# Patient Record
Sex: Male | Born: 1980 | Race: Black or African American | Hispanic: No | Marital: Single | Smoking: Never smoker
Health system: Southern US, Community
[De-identification: ages and names within clinical notes are randomized; demographics above are authoritative.]

## PROBLEM LIST (undated history)

## (undated) DIAGNOSIS — M549 Dorsalgia, unspecified: Secondary | ICD-10-CM

## (undated) DIAGNOSIS — N319 Neuromuscular dysfunction of bladder, unspecified: Secondary | ICD-10-CM

## (undated) DIAGNOSIS — R202 Paresthesia of skin: Secondary | ICD-10-CM

## (undated) HISTORY — PX: OTHER SURGICAL HISTORY: SHX169

## (undated) HISTORY — DX: Paresthesia of skin: R20.2

## (undated) HISTORY — DX: Dorsalgia, unspecified: M54.9

## (undated) HISTORY — DX: Neuromuscular dysfunction of bladder, unspecified: N31.9

---

## 2015-02-04 ENCOUNTER — Emergency Department (HOSPITAL_COMMUNITY)
Admission: EM | Admit: 2015-02-04 | Discharge: 2015-02-04 | Disposition: A | Discharge status: Home / Self Care | Payer: Self-pay | Attending: Emergency Medicine | Admitting: Emergency Medicine

## 2015-02-04 ENCOUNTER — Encounter (HOSPITAL_COMMUNITY): Payer: Self-pay | Admitting: Emergency Medicine

## 2015-02-04 DIAGNOSIS — R350 Frequency of micturition: Secondary | ICD-10-CM | POA: Insufficient documentation

## 2015-02-04 DIAGNOSIS — G8929 Other chronic pain: Secondary | ICD-10-CM | POA: Insufficient documentation

## 2015-02-04 DIAGNOSIS — M549 Dorsalgia, unspecified: Secondary | ICD-10-CM | POA: Insufficient documentation

## 2015-02-04 DIAGNOSIS — R109 Unspecified abdominal pain: Secondary | ICD-10-CM | POA: Insufficient documentation

## 2015-02-04 DIAGNOSIS — R300 Dysuria: Secondary | ICD-10-CM | POA: Insufficient documentation

## 2015-02-04 LAB — COMPREHENSIVE METABOLIC PANEL
ALT: 17 U/L (ref 17–63)
ANION GAP: 10 (ref 5–15)
AST: 22 U/L (ref 15–41)
Albumin: 4 g/dL (ref 3.5–5.0)
Alkaline Phosphatase: 51 U/L (ref 38–126)
BUN: 12 mg/dL (ref 6–20)
CHLORIDE: 104 mmol/L (ref 101–111)
CO2: 23 mmol/L (ref 22–32)
Calcium: 8.8 mg/dL — ABNORMAL LOW (ref 8.9–10.3)
Creatinine, Ser: 1.05 mg/dL (ref 0.61–1.24)
GFR calc Af Amer: >60 mL/min (ref >60)
GFR calc non Af Amer: >60 mL/min (ref >60)
Glucose, Bld: 126 mg/dL — ABNORMAL HIGH (ref 65–99)
POTASSIUM: 4.1 mmol/L (ref 3.5–5.1)
Sodium: 137 mmol/L (ref 135–145)
Total Bilirubin: 0.4 mg/dL (ref 0.3–1.2)
Total Protein: 7.1 g/dL (ref 6.5–8.1)

## 2015-02-04 LAB — CBC WITH DIFFERENTIAL/PLATELET
Basophils Absolute: 0 10*3/uL (ref 0.0–0.1)
Basophils Relative: 0 % (ref 0–1)
EOS PCT: 1 % (ref 0–5)
Eosinophils Absolute: 0.1 10*3/uL (ref 0.0–0.7)
HCT: 42.4 % (ref 39.0–52.0)
HEMOGLOBIN: 14.4 g/dL (ref 13.0–17.0)
Lymphocytes Relative: 53 % — ABNORMAL HIGH (ref 12–46)
Lymphs Abs: 2.7 10*3/uL (ref 0.7–4.0)
MCH: 27.7 pg (ref 26.0–34.0)
MCHC: 34 g/dL (ref 30.0–36.0)
MCV: 81.7 fL (ref 78.0–100.0)
Monocytes Absolute: 0.4 10*3/uL (ref 0.1–1.0)
Monocytes Relative: 8 % (ref 3–12)
Neutro Abs: 1.9 10*3/uL (ref 1.7–7.7)
Neutrophils Relative %: 38 % — ABNORMAL LOW (ref 43–77)
PLATELETS: 165 10*3/uL (ref 150–400)
RBC: 5.19 MIL/uL (ref 4.22–5.81)
RDW: 12.8 % (ref 11.5–15.5)
WBC: 5.1 10*3/uL (ref 4.0–10.5)

## 2015-02-04 LAB — URINALYSIS, ROUTINE W REFLEX MICROSCOPIC
Bilirubin Urine: NEGATIVE
Glucose, UA: NEGATIVE mg/dL
Hgb urine dipstick: NEGATIVE
Ketones, ur: NEGATIVE mg/dL
Leukocytes, UA: NEGATIVE
Nitrite: NEGATIVE
Protein, ur: NEGATIVE mg/dL
SPECIFIC GRAVITY, URINE: 1.016 (ref 1.005–1.030)
Urobilinogen, UA: 1 mg/dL (ref 0.0–1.0)
pH: 7.5 (ref 5.0–8.0)

## 2015-02-04 LAB — I-STAT CREATININE, ED: Creatinine, Ser: 1.1 mg/dL (ref 0.61–1.24)

## 2015-02-04 NOTE — ED Notes (Signed)
Pt. reports urinary frequency , dysuria , back pain and left flank pain onset this week , denies hematuria , no fever or chills.

## 2015-02-04 NOTE — Discharge Instructions (Signed)
Please follow up closely with urologist for further evaluation of your condition.  Use resource below to find a primary care provider for further management of your health.   Dysuria Dysuria is the medical term for pain with urination. There are many causes for dysuria, but urinary tract infection is the most common. If a urinalysis was performed it can show that there is a urinary tract infection. A urine culture confirms that you or your child is sick. You will need to follow up with a healthcare provider because:  If a urine culture was done you will need to know the culture results and treatment recommendations.  If the urine culture was positive, you or your child will need to be put on antibiotics or know if the antibiotics prescribed are the right antibiotics for your urinary tract infection.  If the urine culture is negative (no urinary tract infection), then other causes may need to be explored or antibiotics need to be stopped. Today laboratory work may have been done and there does not seem to be an infection. If cultures were done they will take at least 24 to 48 hours to be completed. Today x-rays may have been taken and they read as normal. No cause can be found for the problems. The x-rays may be re-read by a radiologist and you will be contacted if additional findings are made. You or your child may have been put on medications to help with this problem until you can see your primary caregiver. If the problems get better, see your primary caregiver if the problems return. If you were given antibiotics (medications which kill germs), take all of the mediations as directed for the full course of treatment.  If laboratory work was done, you need to find the results. Leave a telephone number where you can be reached. If this is not possible, make sure you find out how you are to get test results. HOME CARE INSTRUCTIONS   Drink lots of fluids. For adults, drink eight, 8 ounce glasses of  clear juice or water a day. For children, replace fluids as suggested by your caregiver.  Empty the bladder often. Avoid holding urine for long periods of time.  After a bowel movement, women should cleanse front to back, using each tissue only once.  Empty your bladder before and after sexual intercourse.  Take all the medicine given to you until it is gone. You may feel better in a few days, but TAKE ALL MEDICINE.  Avoid caffeine, tea, alcohol and carbonated beverages, because they tend to irritate the bladder.  In men, alcohol may irritate the prostate.  Only take over-the-counter or prescription medicines for pain, discomfort, or fever as directed by your caregiver.  If your caregiver has given you a follow-up appointment, it is very important to keep that appointment. Not keeping the appointment could result in a chronic or permanent injury, pain, and disability. If there is any problem keeping the appointment, you must call back to this facility for assistance. SEEK IMMEDIATE MEDICAL CARE IF:   Back pain develops.  A fever develops.  There is nausea (feeling sick to your stomach) or vomiting (throwing up).  Problems are no better with medications or are getting worse. MAKE SURE YOU:   Understand these instructions.  Will watch your condition.  Will get help right away if you are not doing well or get worse. Document Released: 05/24/2004 Document Revised: 11/18/2011 Document Reviewed: 03/31/2008 [facility] Patient Information 2015 [place], [place]. This information is  not intended to replace advice given to you by your health care provider. Make sure you discuss any questions you have with your health care provider.   Emergency Department Resource Guide 1) Find a Doctor and Pay Out of Pocket Although you won't have to find out who is covered by your insurance plan, it is a good idea to ask around and get recommendations. You will then need to call the office and see if the  doctor you have chosen will accept you as a new patient and what types of options they offer for patients who are self-pay. Some doctors offer discounts or will set up payment plans for their patients who do not have insurance, but you will need to ask so you aren't surprised when you get to your appointment.  2) Contact Your Local Health Department Not all health departments have doctors that can see patients for sick visits, but many do, so it is worth a call to see if yours does. If you don't know where your local health department is, you can check in your phone book. The CDC also has a tool to help you locate your state's health department, and many state websites also have listings of all of their local health departments.  3) Find a Walk-in Clinic If your illness is not likely to be very severe or complicated, you may want to try a walk in clinic. These are popping up all over the country in pharmacies, drugstores, and shopping centers. They're usually staffed by nurse practitioners or physician assistants that have been trained to treat common illnesses and complaints. They're usually fairly quick and inexpensive. However, if you have serious medical issues or chronic medical problems, these are probably not your best option.  No Primary Care Doctor: - Call Health Connect at  [phone] - they can help you locate a primary care doctor that  accepts your insurance, provides certain services, etc. - Physician Referral Service- [phone]  Chronic Pain Problems: Organization         Address  Phone   Notes  [facility]  [phone] Patients need to be referred by their primary care doctor.   Medication Assistance: Organization         Address  Phone   Notes  [facility] Medication [facility] [address] [place], [place] [address] [phone] --Must be a resident of [facility] -- Must have NO insurance coverage whatsoever (no Medicaid/  Medicare, etc.) -- The pt. MUST have a primary care doctor that directs their care regularly and follows them in the community   Med[facility]ist  [phone]   [employer]  [phone]    Agencies that provide inexpensive medical care: Organization         Address  Phone   Notes  [facility]  [phone]   [facility]    [phone]   [facility] [address] [place], [place] [address] [phone]   [facility] [place] [facility] [address] [place] [place] [phone]   Planned Parenthood    [phone]   [facility]    [phone]   Community Health and [facility]  [address], [place] Phone:  [phone], Fax:  [phone] Hours of Operation:  9 am - 6 pm, M-F.  Also accepts Medicaid/Medicare and self-pay.  [facility]  [address], [place] Phone: [phone]) [phone], Fax: [phone]) [phone]. Hours of Operation:  8:30 am - 5:30 pm, M-F.  Also accepts Medicaid and self-pay.  [facility] [place] [address], [place] Phone: [phone](336) [phone]   [facility] [address], [place], [place] [phone], Ext. [phone] Mondays & Thursdays: 7-9 AM.  First 15 patients are seen on a first come, first serve basis.    Medicaid-accepting [facility] Providers:  Organization         Address  Phone   Notes  [facility] [address], [place] [phone]) [phone] Also accepts self-pay patients.  [facility] [address], [place]  [phone]) [phone]   [facility] [address], [place] [phone]) [phone]   [facility] [address] [place] [address], [place] [phone]) [phone]   Renaye RakersVeita Bland [address], [place]   [phone](336) [phone] Only accepts [place] Access [place] patients after they have their name applied to their card.    Self-Pay (no insurance) in [facility] [place]:  Organization         Address  Phone   Notes  Sickle Cell Patients, [facility] [address], [place] [phone]) [phone]   [facility] [address], [place] [phone]   [facility]  [address], [place] [phone]) [phone]   [facility]/Dr. Osei-Bonsu  [address] [place] [address], [place] or [phone], [place] [phone]) [phone] Phone number for both [place] and [place] locations is the same.  [facility]gent [facility] and [facility] [address], [place] [phone]) [phone]   [facility] [place] [address] [place] [address], [place] or [address] [phone]) [phone]   [facility] [address], [place] [phone], phone; [phone], fax Sees patients 1st and 3rd Saturday of every month.  Must not qualify for public or private insurance (i.e. Medicaid, Medicare, [place] Health Choice, Veterans' Benefits)  Household income should be no more than 200% of the poverty level The clinic cannot treat you if you are pregnant or think you are pregnant  Sexually transmitted diseases are not treated at the clinic.    Dental Care: Organization         Address  Phone  Notes  [facility] Department of [facility] [address], [place] [phone] Accepts children up to age 34 who are enrolled in [place] or [place] Health Choice; pregnant women with a Medicaid card; and children who have applied for Medicaid or [place] Health Choice, but were declined, whose parents can pay a reduced fee at time of service.  [facility] of [facility] [place]  [address], [place] [phone] Accepts children up to age 34 who are enrolled in [place] or [place] Health Choice; pregnant women with a Medicaid card; and children who have applied for Medicaid or  Health Choice,  but were declined, whose parents can pay a reduced fee at time of service.  [facility]  [address], [place] [phone]) [phone] Patients are seen by appointment only. Walk-ins are not accepted. [facility] will see patients 34 years of age and older. Monday - Tuesday (8am-5pm) Most Wednesdays (8:30-5pm) $30 per visit, cash only  [facility]  [address], [facility] [phone] Patients are seen by appointment only. Walk-ins are not accepted. [facility] will see patients 12 years of age and older. One Wednesday Evening (Monthly: Volunteer Based).  $30 per visit, cash only  [employer]  [phone] for adults; Children under age 36, call Graduate Pediatric Dentistry at [phone]. Children aged 62-14, please call [phone] to request a pediatric application.  Dental services are provided in all areas of dental care including fillings, crowns and bridges, complete and partial dentures, implants, gum treatment, root canals, and extractions. Preventive care is also provided. Treatment is provided to both adults and children. Patients are selected via a lottery and there is often a waiting list.   [facility] [address] [place]  [phone] [email or web address]   [facility] [address] [place], [place] [phone], Ext. [phone] Second and Fourth Thursday of each month, opens at 6:30 AM; Clinic ends at 9 AM.  Patients are seen on a first-come first-served basis, and a limited number are seen during each clinic.   [facility]  [address] [place], [place] [phone]   Eligibility Requirements You must have lived in [place], [place], or [place] counties for at least the last three months.   You cannot be eligible for state or federal sponsored [place], including [employer], [place], or [employer].   You generally  cannot be eligible for healthcare insurance through your employer.    How to apply: Eligibility screenings are held every Tuesday and Wednesday afternoon from 1:00 pm until 4:00 pm. You do not need an appointment for the interview!  [facility] [address], [place], [place] [phone]   [facility] Health Department  [phone]   [facility] Health Department  [phone]   [facility] Health Department  [phone]    Behavioral Health Resources in the Community: Intensive Outpatient Programs Organization         Address  Phone  Notes  [facility] Services [address] [place], [place] [phone]   [facility] Outpatient [address], [place], [place] [phone]   [facility] [address], [place], [place]  [phone]   [facility] [address]  [place], [place] [phone] or [phone]   Substance Abuse Resources Organization         Address  Phone  Notes  Al[facility] Drug Services  [phone]   [facility]  [phone]   [facility] [place]  [phone]   Floydene Flock  [phone]   Residential & Outpatient Substance Abuse Program  [phone]   Psychological Services Organization         Address  Phone  Notes  [facility]al Health  [phone]   [facility]  [phone]   [facility] Health [address] [place] [phone] or [phone]    Mobile Crisis Teams Organization         Address  Phone  Notes  [facility], Mobile Crisis Care Unit  [phone]   [facility]  [address] [place], [place] [phone]   Doristine Locks [address] [place] [phone]    Self-Help/Support Groups Organization         Address  Phone             Notes  [facility] [place] - variety  of support groups  336- [phone] Call for more  information  Narcotics Anonymous (NA), Caring Services [address], [employer] [place]  2 meetings at this location   Residential Sports administrator         Address  Phone  Notes  [facility] [address],    [place]  [phone]   [facility]  [address], [place] [address], [place], [place] [phone]   [facility] [address] [place], [place] [phone] Admissions: 8am-3pm M-F  [facility] [address],    [place], [place] [phone]   [facility] [address] [place], [place], [place] [phone]   [facility] [address],  [place], [place] [phone]   [facility] - Intensive Outpatient [address], [place], [place] [phone]   [facility] ([facility] [address] [place]  [place], [place] [phone] or [phone]   [facility] ([facility]) [address], [place], [place] [phone] Accepts Medicaid  [facility] [place] [address]  [place] [phone] Substance Abuse/Addiction Treatment   [facility] Organization         Address  Phone  Notes  [facility]  [phone]   Angie Fava, PhD [address] [place], [place]   [phone] or [phone]   [facility]   [address] [place], [place] [phone]   [facility] [address], [place], [place] [phone] Insurance/Medicaid/sponsorship through [facility] and Families [address]                                    [place], [place] [phone] Therapy/tele-psych/case  [facility] [address], [place] [phone]    Dr. Lolly Mustache  [phone]   [facility] [place]  [employer] [facility] Dept. 1) [address] [place] 2) [address] [place] 3)  [address], [place] [phone]  [phone]   [facility] Child Abuse Hotline 530-544-4879 or [phone] (After Hours)

## 2015-02-04 NOTE — ED Provider Notes (Signed)
CSN: [id number]     Arrival date & time 02/04/15  2153 History   First MD Initiated Contact with Patient 02/04/15 2213     Chief Complaint  Patient presents with  . Urinary Frequency  . Dysuria  . Back Pain     (Consider location/radiation/quality/duration/timing/severity/associated sxs/prior Treatment) HPI   34 year old male presents for evaluation of dysuria. Patient reports for more than a year he has had urinary frequency, burning urination, left flank pain and abdominal pain. Symptom has been waxing waning. Most recent dysuria has been ongoing for the past month and has progressively worse. States it burns when he urinates. Report having been evaluated for this multiple times in the past with occasional diagnosis of UTI but no diagnosis of STD. States that antibiotic does help temporarily but symptoms then returned. He has not been sexually active for the past 3 months. He denies any abnormal weight loss, fever, chills, nausea vomiting diarrhea, hematuria, penile discharge, or rash. He has never been seen by specialist for this. [place] from [place]. No prior history of kidney stones.  History reviewed. No pertinent past medical history. History reviewed. No pertinent past surgical history. No family history on file. History  Substance Use Topics  . Smoking status: Never Smoker   . Smokeless tobacco: Not on file  . Alcohol Use: No    Review of Systems  Constitutional: Negative for fever.  Genitourinary: Positive for dysuria, frequency and flank pain. Negative for hematuria, discharge, penile swelling, scrotal swelling and testicular pain.  Skin: Negative for rash.      Allergies  Review of patient's allergies indicates no known allergies.  Home Medications   Prior to Admission medications   Medication Sig Start Date End Date Taking? Authorizing Provider  ibuprofen (ADVIL,MOTRIN) 200 MG tablet Take 400 mg by mouth every 6 (six) hours as needed for moderate pain.   Yes  Historical Provider, MD   BP 147/87 mmHg  Pulse 76  Temp(Src) 98.3 F (36.8 C) (Oral)  Resp 16  Ht 5\' 9"  (1.753 m)  Wt 168 lb (76.204 kg)  BMI 24.80 kg/m2  SpO2 99% Physical Exam  Constitutional: He appears well-developed and well-nourished. No distress.  HENT:  Head: Atraumatic.  Eyes: Conjunctivae are normal.  Neck: Neck supple.  Cardiovascular: Normal rate and regular rhythm.   Pulmonary/Chest: Effort normal and breath sounds normal.  Abdominal: Soft. Bowel sounds are normal. He exhibits no distension. There is no tenderness.  Genitourinary:  Chaperone present: Circumcised penis with dry flaky skin to the corona without obvious infection. No penile discharge. Small Urethral opening. No testicular tenderness or scrotal swelling. No inguinal lymphadenopathy or inguinal hernia.  No CVA tenderness.  Neurological: He is alert.  Skin: No rash noted.  Psychiatric: He has a normal mood and affect.  Nursing note and vitals reviewed.   ED Course  Procedures (including critical care time)  10:42 PM Patient here with recurrent urinary frequency, flank pain and abdominal pain. Symptom has been ongoing for more than 1 year. I suspect that his symptoms would need to be evaluated further by urologist to identify an actual cause. Today his urinalysis shows no evidence of urinary tract infection.  11:15 PM Patient is afebrile, vital signs stable. Normal renal function, UA shows no evidence of urinary tract infection, labs are reassuring, electrolytes are reassuring. I would like patient to follow-up with specialist and I also provided resources for outpatient follow-up. Return precautions discussed.  Labs Review Labs Reviewed  CBC WITH DIFFERENTIAL/PLATELET -  Abnormal; Notable for the following:    Neutrophils Relative % 38 (*)    Lymphocytes Relative 53 (*)    All other components within normal limits  COMPREHENSIVE METABOLIC PANEL - Abnormal; Notable for the following:    Glucose,  Bld 126 (*)    Calcium 8.8 (*)    All other components within normal limits  URINALYSIS, ROUTINE W REFLEX MICROSCOPIC (NOT AT [facility])  RPR  HIV ANTIBODY (ROUTINE TESTING)  I-STAT CREATININE, ED  GC/CHLAMYDIA PROBE AMP () NOT AT [facility]    Imaging Review No results found.   EKG Interpretation None      MDM   Final diagnoses:  Dysuria  Left flank pain, chronic    BP 147/87 mmHg  Pulse 76  Temp(Src) 98.3 F (36.8 C) (Oral)  Resp 16  Ht  (1.753 m)  Wt 168 lb (76.204 kg)  BMI 24.80 kg/m2  SpO2 99%     Fayrene Helper, PA-C 02/04/15 2316  Donnetta Hutching, MD 02/04/15 2329

## 2015-02-05 LAB — RPR: RPR Ser Ql: NONREACTIVE

## 2015-02-05 LAB — HIV ANTIBODY (ROUTINE TESTING W REFLEX): HIV Screen 4th Generation wRfx: NONREACTIVE

## 2015-02-07 LAB — GC/CHLAMYDIA PROBE AMP (~~LOC~~) NOT AT ARMC
Chlamydia: NEGATIVE
Neisseria Gonorrhea: NEGATIVE

## 2015-05-30 ENCOUNTER — Ambulatory Visit (INDEPENDENT_AMBULATORY_CARE_PROVIDER_SITE_OTHER): Payer: BLUE CROSS/BLUE SHIELD | Admitting: Urgent Care

## 2015-05-30 VITALS — BP 118/72 | HR 67 | Temp 98.5°F | Resp 18 | Ht 70.0 in | Wt 175.0 lb

## 2015-05-30 DIAGNOSIS — R7309 Other abnormal glucose: Secondary | ICD-10-CM

## 2015-05-30 DIAGNOSIS — R350 Frequency of micturition: Secondary | ICD-10-CM | POA: Diagnosis not present

## 2015-05-30 DIAGNOSIS — R351 Nocturia: Secondary | ICD-10-CM

## 2015-05-30 DIAGNOSIS — R109 Unspecified abdominal pain: Secondary | ICD-10-CM | POA: Diagnosis not present

## 2015-05-30 DIAGNOSIS — R7303 Prediabetes: Secondary | ICD-10-CM

## 2015-05-30 DIAGNOSIS — R300 Dysuria: Secondary | ICD-10-CM

## 2015-05-30 DIAGNOSIS — N419 Inflammatory disease of prostate, unspecified: Secondary | ICD-10-CM | POA: Diagnosis not present

## 2015-05-30 LAB — POC MICROSCOPIC URINALYSIS (UMFC): Mucus: ABSENT

## 2015-05-30 LAB — POCT CBC
Granulocyte percent: 42.6 %G (ref 37–80)
HCT, POC: 46.2 % (ref 43.5–53.7)
HEMOGLOBIN: 14.4 g/dL (ref 14.1–18.1)
LYMPH, POC: 2.3 (ref 0.6–3.4)
MCH, POC: 25.8 pg — AB (ref 27–31.2)
MCHC: 31.1 g/dL — AB (ref 31.8–35.4)
MCV: 82.8 fL (ref 80–97)
MID (cbc): 0.4 (ref 0–0.9)
MPV: 9.2 fL (ref 0–99.8)
PLATELET COUNT, POC: 165 10*3/uL (ref 142–424)
POC Granulocyte: 2 (ref 2–6.9)
POC LYMPH %: 49.5 % (ref 10–50)
POC MID %: 7.9 %M (ref 0–12)
RBC: 5.58 M/uL (ref 4.69–6.13)
RDW, POC: 13.2 %
WBC: 4.6 10*3/uL (ref 4.6–10.2)

## 2015-05-30 LAB — POCT URINALYSIS DIP (MANUAL ENTRY)
Bilirubin, UA: NEGATIVE
Glucose, UA: NEGATIVE
Ketones, POC UA: NEGATIVE
LEUKOCYTES UA: NEGATIVE
Nitrite, UA: NEGATIVE
PH UA: 7.5
PROTEIN UA: NEGATIVE
Spec Grav, UA: 1.015
Urobilinogen, UA: 0.2

## 2015-05-30 LAB — POCT GLYCOSYLATED HEMOGLOBIN (HGB A1C): HEMOGLOBIN A1C: 5.9

## 2015-05-30 MED ORDER — CIPROFLOXACIN HCL 500 MG PO TABS: 500.0000 mg | ORAL_TABLET | Freq: Two times a day (BID) | ORAL | Status: DC

## 2015-05-30 NOTE — Patient Instructions (Signed)

## 2015-05-30 NOTE — Progress Notes (Signed)
MRN: [id number] DOB: 06/24/1981  Subjective:   Joseph Bridges is a 34 y.o. male presenting for chief complaint of burning with urination; Back Pain; Flank Pain; and Abdominal Pain  Reports >1 year history of intermittent dysuria, urinary frequency and intermittent hematuria. Patient has been seen by multiple providers at different practices for same complaint. He has undergone short courses of antibiotics for urinary tract infections. Patient states that he gets temporary relief from this but has had resolution. Today he reports that he continues to have urinary frequency, stinging and pressure sensation prior to urinating, now having some left-sided flank pain for the past month. Denies fever, nausea, vomiting, abdominal pain, hematuria, genital rashes, penile discharge. Patient has been tested for STI's and has been negative for HIV, syphilis, gonorrhea, Chlamydia. He has not been sexually active since his last testing. Not been evaluated by urology, denies history of kidney stones. Patient reports that he hydrates very well. Denies any other aggravating or relieving factors, no other questions or concerns.  [facility] has a current medication list which includes the following prescription(s): ibuprofen. Also has No Known Allergies.  [facility]  has no past medical history on file. Also  has no past surgical history on file.  Objective:   Vitals: BP 118/72 mmHg  Pulse 67  Temp(Src) 98.5 F (36.9 C) (Oral)  Resp 18  Ht  (1.778 m)  Wt 175 lb (79.379 kg)  BMI 25.11 kg/m2  SpO2 98%  Physical Exam  Constitutional: He is oriented to person, place, and time. He appears well-developed and well-nourished.  Cardiovascular: Normal rate.   Pulmonary/Chest: Effort normal.  Genitourinary: Rectal exam shows no external hemorrhoid, no internal hemorrhoid, no fissure, no mass and no tenderness.    Prostate is not enlarged (without nodules) and not tender.  Neurological: He is alert and  oriented to person, place, and time.  Skin: Skin is warm and dry. No rash noted. No erythema. No pallor.   Results for orders placed or performed in visit on 05/30/15 (from the past 24 hour(s))  POCT urinalysis dipstick     Status: Abnormal   Collection Time: 05/30/15  4:26 PM  Result Value Ref Range   Color, UA yellow yellow   Clarity, UA clear clear   Glucose, UA negative negative   Bilirubin, UA negative negative   Ketones, POC UA negative negative   Spec Grav, UA 1.015    Blood, UA trace-intact (A) negative   pH, UA 7.5    Protein Ur, POC negative negative   Urobilinogen, UA 0.2    Nitrite, UA Negative Negative   Leukocytes, UA Negative Negative  POCT Microscopic Urinalysis ([facility])     Status: Abnormal   Collection Time: 05/30/15  4:26 PM  Result Value Ref Range   WBC,UR,HPF,POC None None WBC/hpf   RBC,UR,HPF,POC Few (A) None RBC/hpf   Bacteria None None   Mucus Absent Absent   Epithelial Cells, UR Per Microscopy None None cells/hpf  POCT CBC     Status: Abnormal   Collection Time: 05/30/15  4:57 PM  Result Value Ref Range   WBC 4.6 4.6 - 10.2 K/uL   Lymph, poc 2.3 0.6 - 3.4   POC LYMPH PERCENT 49.5 10 - 50 %L   MID (cbc) 0.4 0 - 0.9   POC MID % 7.9 0 - 12 %M   POC Granulocyte 2.0 2 - 6.9   Granulocyte percent 42.6 37 - 80 %G   RBC 5.58 4.69 - 6.13 M/uL  Hemoglobin 14.4 14.1 - 18.1 g/dL   HCT, POC 21.3 08.6 - 53.7 %   MCV 82.8 80 - 97 fL   MCH, POC 25.8 (A) 27 - 31.2 pg   MCHC 31.1 (A) 31.8 - 35.4 g/dL   RDW, POC 57.8 %   Platelet Count, POC 165 142 - 424 K/uL   MPV 9.2 0 - 99.8 fL  POCT glycosylated hemoglobin (Hb A1C)     Status: None   Collection Time: 05/30/15  5:03 PM  Result Value Ref Range   Hemoglobin A1C 5.9     Assessment and Plan :   1. Prostatitis, unspecified prostatitis type 2. Burning with urination 3. Urinary frequency 4. 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 Consider possibility of  kidney stone although less likely given the long-standing symptoms. If no improvement with course of Cipro we'll refer to urology. Follow-up in 3 weeks.  5. Left sided abdominal pain - Comprehensive metabolic panel pending. May be referred pain from possible kidney stone.  6. Pre-diabetes - Advised dietary modifications, this may be a contributing factor to his urinary frequency.    Wallis Bamberg, PA-C [facility] [phone] 05/30/2015 4:24 PM

## 2015-05-31 LAB — COMPREHENSIVE METABOLIC PANEL
ALT: 28 U/L (ref 9–46)
AST: 23 U/L (ref 10–40)
Albumin: 4.6 g/dL (ref 3.6–5.1)
Alkaline Phosphatase: 50 U/L (ref 40–115)
BILIRUBIN TOTAL: 0.4 mg/dL (ref 0.2–1.2)
BUN: 12 mg/dL (ref 7–25)
CO2: 29 mmol/L (ref 20–31)
CREATININE: 1.1 mg/dL (ref 0.60–1.35)
Calcium: 9.4 mg/dL (ref 8.6–10.3)
Chloride: 101 mmol/L (ref 98–110)
Glucose, Bld: 72 mg/dL (ref 65–99)
Potassium: 4.1 mmol/L (ref 3.5–5.3)
SODIUM: 139 mmol/L (ref 135–146)
Total Protein: 7.6 g/dL (ref 6.1–8.1)

## 2015-06-01 ENCOUNTER — Telehealth: Payer: Self-pay | Admitting: Urgent Care

## 2015-06-01 LAB — PSA: PSA: 0.83 ng/mL (ref <=4.00)

## 2015-06-01 NOTE — Telephone Encounter (Signed)
Reported normal PSA and Cmet. Than likely patient has had chronic prostatitis but also possible that he has had renal stones. Continue plan as discussed in clinic including Cipro for 3 weeks. Recommended patient return to clinic if he has worsening symptoms. If there is no improvement with antibiotic course will send him to urology.

## 2015-06-16 ENCOUNTER — Telehealth: Payer: Self-pay

## 2015-06-16 DIAGNOSIS — R300 Dysuria: Secondary | ICD-10-CM

## 2015-06-16 DIAGNOSIS — N419 Inflammatory disease of prostate, unspecified: Secondary | ICD-10-CM

## 2015-06-16 NOTE — Telephone Encounter (Signed)
States medication did not work.  Wants something else called in to [facility] on [address]

## 2015-06-17 NOTE — Telephone Encounter (Signed)
Spoke with Joseph Bridges, referral is going to be put in for urology for patient.   Advised patient that if symptoms get worse he will need to RTC, Patient understood.

## 2015-06-20 ENCOUNTER — Telehealth: Payer: Self-pay | Admitting: *Deleted

## 2015-06-20 NOTE — Telephone Encounter (Signed)
Patient called wanting more medication symptoms are not getting better per Lanier Clam patient was to return to clinic.  Patient understood.

## 2015-06-22 ENCOUNTER — Ambulatory Visit (INDEPENDENT_AMBULATORY_CARE_PROVIDER_SITE_OTHER): Payer: BLUE CROSS/BLUE SHIELD | Admitting: Emergency Medicine

## 2015-06-22 VITALS — BP 110/74 | HR 67 | Temp 98.7°F | Resp 16 | Ht 69.5 in | Wt 172.8 lb

## 2015-06-22 DIAGNOSIS — R351 Nocturia: Secondary | ICD-10-CM

## 2015-06-22 DIAGNOSIS — R300 Dysuria: Secondary | ICD-10-CM | POA: Diagnosis not present

## 2015-06-22 DIAGNOSIS — N400 Enlarged prostate without lower urinary tract symptoms: Secondary | ICD-10-CM | POA: Diagnosis not present

## 2015-06-22 LAB — POC MICROSCOPIC URINALYSIS (UMFC): Mucus: ABSENT

## 2015-06-22 LAB — POCT URINALYSIS DIP (MANUAL ENTRY)
BILIRUBIN UA: NEGATIVE
BILIRUBIN UA: NEGATIVE
Blood, UA: NEGATIVE
GLUCOSE UA: NEGATIVE
LEUKOCYTES UA: NEGATIVE
NITRITE UA: NEGATIVE
Protein Ur, POC: NEGATIVE
Spec Grav, UA: 1.02
Urobilinogen, UA: 0.2
pH, UA: 7

## 2015-06-22 MED ORDER — TAMSULOSIN HCL 0.4 MG PO CAPS: 0.4000 mg | ORAL_CAPSULE | Freq: Every day | ORAL | Status: DC

## 2015-06-22 NOTE — Progress Notes (Signed)
Subjective:  Patient ID: Joseph Bridges, male    DOB: 12-27-1980  Age: 34 y.o. MRN: [id number]  CC: Follow-up   HPI [employer]ley [employer] presents  with continued symptoms of dysuria urgency and frequency. He says he has nocturia 2 or 3 times a night he has some initial hesitancy when he urinates. Doesn't have any dripping at the end has no fever or chills. He has no STDs. He's been tested repeatedly for STDs and undergone several treatments with antibiotics with no improvement in his symptoms  History Esaiah has no past medical history on file.   He has no past surgical history on file.   His  family history is not on file.  He   reports that he has never smoked. He does not have any smokeless tobacco history on file. He reports that he does not drink alcohol or use illicit drugs.  Outpatient Prescriptions Prior to Visit  Medication Sig Dispense Refill  . ciprofloxacin (CIPRO) 500 MG tablet Take 1 tablet (500 mg total) by mouth 2 (two) times daily. 42 tablet 0   No facility-administered medications prior to visit.    Social History   Social History  . Marital Status: Single    Spouse Name: N/A  . Number of Children: N/A  . Years of Education: N/A   Social History Main Topics  . Smoking status: Never Smoker   . Smokeless tobacco: None  . Alcohol Use: No  . Drug Use: No  . Sexual Activity: Not Asked   Other Topics Concern  . None   Social History Narrative     Review of Systems  Constitutional: Negative for fever, chills and appetite change.  HENT: Negative for congestion, ear pain, postnasal drip, sinus pressure and sore throat.   Eyes: Negative for pain and redness.  Respiratory: Negative for cough, shortness of breath and wheezing.   Cardiovascular: Negative for leg swelling.  Gastrointestinal: Negative for nausea, vomiting, abdominal pain, diarrhea, constipation and blood in stool.  Endocrine: Negative for polyuria.  Genitourinary: Positive for dysuria,  urgency and frequency. Negative for hematuria, flank pain, discharge, penile swelling, scrotal swelling, penile pain and testicular pain.  Musculoskeletal: Negative for gait problem.  Skin: Negative for rash.  Neurological: Negative for weakness and headaches.  Psychiatric/Behavioral: Negative for confusion and decreased concentration. The patient is not nervous/anxious.     Objective:  BP 110/74 mmHg  Pulse 67  Temp(Src) 98.7 F (37.1 C) (Oral)  Resp 16  Ht 5' 9.5" (1.765 m)  Wt 172 lb 12.8 oz (78.382 kg)  BMI 25.16 kg/m2  SpO2 99%  Physical Exam  Constitutional: He is oriented to person, place, and time. He appears well-developed and well-nourished. No distress.  HENT:  Head: Normocephalic and atraumatic.  Right Ear: External ear normal.  Left Ear: External ear normal.  Nose: Nose normal.  Eyes: Conjunctivae and EOM are normal. Pupils are equal, round, and reactive to light. No scleral icterus.  Neck: Normal range of motion. Neck supple. No tracheal deviation present.  Cardiovascular: Normal rate, regular rhythm and normal heart sounds.   Pulmonary/Chest: Effort normal. No respiratory distress. He has no wheezes. He has no rales.  Abdominal: He exhibits no mass. There is no tenderness. There is no rebound and no guarding.  Musculoskeletal: He exhibits no edema.  Lymphadenopathy:    He has no cervical adenopathy.  Neurological: He is alert and oriented to person, place, and time. Coordination normal.  Skin: Skin is warm and dry. No  rash noted.  Psychiatric: He has a normal mood and affect. His behavior is normal.      Assessment & Plan:   Clayton was seen today for follow-up.  Diagnoses and all orders for this visit:  Dysuria -     Ambulatory referral to Urology -     POCT urinalysis dipstick -     POCT Microscopic Urinalysis ([facility])  Prostatism  Nocturia  Other orders -     tamsulosin (FLOMAX) 0.4 MG CAPS capsule; Take 1 capsule (0.4 mg total) by mouth  daily.  I have discontinued Mr. Leitha Bleakkenten's ciprofloxacin. I am also having him start on tamsulosin.  Meds ordered this encounter  Medications  . tamsulosin (FLOMAX) 0.4 MG CAPS capsule    Sig: Take 1 capsule (0.4 mg total) by mouth daily.    Dispense:  30 capsule    Refill:  3    Appropriate red flag conditions were discussed with the patient as well as actions that should be taken.  Patient expressed his understanding.  Follow-up: Return if symptoms worsen or fail to improve.  Carmelina DaneAnderson, Caleah Tortorelli S, MD   Results for orders placed or performed in visit on 06/22/15  POCT urinalysis dipstick  Result Value Ref Range   Color, UA yellow yellow   Clarity, UA clear clear   Glucose, UA negative negative   Bilirubin, UA negative negative   Ketones, POC UA negative negative   Spec Grav, UA 1.020    Blood, UA negative negative   pH, UA 7.0    Protein Ur, POC negative negative   Urobilinogen, UA 0.2    Nitrite, UA Negative Negative   Leukocytes, UA Negative Negative  POCT Microscopic Urinalysis ([facility])  Result Value Ref Range   WBC,UR,HPF,POC None None WBC/hpf   RBC,UR,HPF,POC None None RBC/hpf   Bacteria None None   Mucus Absent Absent   Epithelial Cells, UR Per Microscopy None None cells/hpf

## 2015-06-22 NOTE — Patient Instructions (Signed)
Benign Prostatic Hyperplasia An enlarged prostate (benign prostatic hyperplasia) is common in older men. You may experience the following:  Weak urine stream.  Dribbling.  Feeling like the bladder has not emptied completely.  Difficulty starting urination.  Getting up frequently at night to urinate.  Urinating more frequently during the day. HOME CARE INSTRUCTIONS  Monitor your prostatic hyperplasia for any changes. The following actions may help to alleviate any discomfort you are experiencing:  Give yourself time when you urinate.  Stay away from alcohol.  Avoid beverages containing caffeine, such as coffee, tea, and colas, because they can make the problem worse.  Avoid decongestants, antihistamines, and some prescription medicines that can make the problem worse.  Follow up with your health care provider for further treatment as recommended. SEEK MEDICAL CARE IF:  You are experiencing progressive difficulty voiding.  Your urine stream is progressively getting narrower.  You are awaking from sleep with the urge to void more frequently.  You are constantly feeling the need to void.  You experience loss of urine, especially in small amounts. SEEK IMMEDIATE MEDICAL CARE IF:   You develop increased pain with urination or are unable to urinate.  You develop severe abdominal pain, vomiting, a high fever, or fainting.  You develop back pain or blood in your urine. MAKE SURE YOU:   Understand these instructions.  Will watch your condition.  Will get help right away if you are not doing well or get worse.   This information is not intended to replace advice given to you by your health care provider. Make sure you discuss any questions you have with your health care provider.   Document Released: 08/26/2005 Document Revised: 09/16/2014 Document Reviewed: 01/26/2013 Elsevier Interactive Patient Education 2016 Elsevier Inc.  

## 2015-07-04 ENCOUNTER — Telehealth: Payer: Self-pay

## 2015-07-04 NOTE — Telephone Encounter (Signed)
Pt called. He is still not doing well and his Uro appt isn't until 12/12. Advised to RTC for re check. He said he would RTC tomorrow.

## 2015-07-05 ENCOUNTER — Ambulatory Visit (INDEPENDENT_AMBULATORY_CARE_PROVIDER_SITE_OTHER): Payer: BLUE CROSS/BLUE SHIELD

## 2015-07-05 ENCOUNTER — Ambulatory Visit (INDEPENDENT_AMBULATORY_CARE_PROVIDER_SITE_OTHER): Payer: BLUE CROSS/BLUE SHIELD | Admitting: Family Medicine

## 2015-07-05 VITALS — BP 126/80 | HR 71 | Temp 98.9°F | Resp 18 | Ht 69.5 in | Wt 175.0 lb

## 2015-07-05 DIAGNOSIS — R109 Unspecified abdominal pain: Secondary | ICD-10-CM

## 2015-07-05 DIAGNOSIS — K639 Disease of intestine, unspecified: Secondary | ICD-10-CM | POA: Diagnosis not present

## 2015-07-05 LAB — POC MICROSCOPIC URINALYSIS (UMFC)

## 2015-07-05 LAB — POCT URINALYSIS DIP (MANUAL ENTRY)
Bilirubin, UA: NEGATIVE
Glucose, UA: NEGATIVE
Ketones, POC UA: NEGATIVE
Leukocytes, UA: NEGATIVE
Nitrite, UA: NEGATIVE
Protein Ur, POC: NEGATIVE
Spec Grav, UA: 1.02
Urobilinogen, UA: 0.2
pH, UA: 8

## 2015-07-05 MED ORDER — POLYETHYLENE GLYCOL 3350 17 GM/SCOOP PO POWD: 17.0000 g | Freq: Two times a day (BID) | ORAL | Status: DC | PRN

## 2015-07-05 NOTE — Progress Notes (Signed)
Patient ID: Joseph Bridges, male   DOB: 1981/05/08, 34 y.o.   MRN: [id number]  By signing my name below, I, [name], attest that this documentation has been prepared under the direction and in the presence of Elvina Sidle, MD Electronically Signed: Charline Bills, ED Scribe 07/05/2015 at 3:34 PM.  Patient ID: Joseph Bridges MRN: [id number], DOB: 04/04/1981, 34 y.o. Date of Encounter: 07/05/2015, 4:20 PM  Primary Physician: No primary care provider on file.  Chief Complaint  Patient presents with  . Follow-up    for back pain. it is not doing better, the pain got worse    HPI: 34 y.o. year old male with history below presents for a follow-up regarding left flank pain. Pt states that pain initially started 5-6 months ago but has worsened over the past 2 months. He further reports worsened flank pain over the past 4 days. He was seen 13 days ago for dysuria and urinary frequency. He has tried treating flank pain with Tylenol and ibuprofen without significant relief. Pt denies nausea and SOB. He also denies heavy lifting at work. Pt has been seen 4 times for similar symptoms since May 2016.   No past medical history on file.   Home Meds: Prior to Admission medications   Medication Sig Start Date End Date Taking? Authorizing Provider  tamsulosin (FLOMAX) 0.4 MG CAPS capsule Take 1 capsule (0.4 mg total) by mouth daily. 06/22/15  Yes Carmelina Dane, MD    Allergies: No Known Allergies  Social History   Social History  . Marital Status: Single    Spouse Name: N/A  . Number of Children: N/A  . Years of Education: N/A   Occupational History  . Not on file.   Social History Main Topics  . Smoking status: Never Smoker   . Smokeless tobacco: Not on file  . Alcohol Use: No  . Drug Use: No  . Sexual Activity: Not on file   Other Topics Concern  . Not on file   Social History Narrative     Review of Systems: Constitutional: negative for chills, fever, night  sweats, weight changes, or fatigue  HEENT: negative for vision changes, hearing loss, congestion, rhinorrhea, ST, epistaxis, or sinus pressure Cardiovascular: negative for chest pain or palpitations Respiratory: negative for hemoptysis, wheezing, shortness of breath, or cough Abdominal: negative for abdominal pain, nausea, vomiting, diarrhea, or constipation GU: +flank pain Dermatological: negative for rash Neurologic: negative for headache, dizziness, or syncope All other systems reviewed and are otherwise negative with the exception to those above and in the HPI.  Physical Exam: Blood pressure 126/80, pulse 71, temperature 98.9 F (37.2 C), temperature source Oral, resp. rate 18, height 5' 9.5" (1.765 m), weight 175 lb (79.379 kg), SpO2 99 %., Body mass index is 25.48 kg/(m^2). General: Well developed, well nourished, in no acute distress. Head: Normocephalic, atraumatic, eyes without discharge, sclera non-icteric, nares are without discharge. Bilateral auditory canals clear, TM's are without perforation, pearly grey and translucent with reflective cone of light bilaterally. Oral cavity moist, posterior pharynx without exudate, erythema, peritonsillar abscess, or post nasal drip.  Neck: Supple. No thyromegaly. Full ROM. No lymphadenopathy. Lungs: Clear bilaterally to auscultation without wheezes, rales, or rhonchi. Breathing is unlabored. Heart: RRR with S1 S2. No murmurs, rubs, or gallops appreciated. Abdomen: Soft, non-tender, non-distended with normoactive bowel sounds. No hepatomegaly. No rebound/guarding. No obvious abdominal masses. No flank tenderness. Msk:  Strength and tone normal for age. Extremities/Skin: Warm and dry. No clubbing or  cyanosis. No edema. No rashes or suspicious lesions. Negative straight leg raises.  Neuro: Alert and oriented X 3. Moves all extremities spontaneously. Gait is normal. CNII-XII grossly in tact. Psych:  Responds to questions appropriately with a normal  affect.   Labs: Results for orders placed or performed in visit on 07/05/15  POCT urinalysis dipstick  Result Value Ref Range   Color, UA yellow yellow   Clarity, UA clear clear   Glucose, UA negative negative   Bilirubin, UA negative negative   Ketones, POC UA negative negative   Spec Grav, UA 1.020    Blood, UA trace-intact (A) negative   pH, UA 8.0    Protein Ur, POC negative negative   Urobilinogen, UA 0.2    Nitrite, UA Negative Negative   Leukocytes, UA Negative Negative  POCT Microscopic Urinalysis ([facility])  Result Value Ref Range   WBC,UR,HPF,POC None None WBC/hpf   RBC,UR,HPF,POC Few (A) None RBC/hpf   Bacteria None None, Too numerous to count   Mucus Present (A) Absent   Epithelial Cells, UR Per Microscopy Few (A) None, Too numerous to count cells/hpf    [facility] reading (PRIMARY) by  Dr. Milus GlazierLauenstein:  KUB and lumbar --> negative   ASSESSMENT AND PLAN:  34 y.o. year old male with  1. Left flank pain   2. Splenic flexure syndrome    This chart was scribed in my presence and reviewed by me personally.    ICD-9-CM ICD-10-CM   1. Left flank pain 789.09 R10.9 POCT urinalysis dipstick     POCT Microscopic Urinalysis ([facility])     DG Abd 1 View     DG Lumbar Spine 2-3 Views     polyethylene glycol powder (GLYCOLAX/MIRALAX) powder  2. Splenic flexure syndrome 569.89 K63.9 polyethylene glycol powder (GLYCOLAX/MIRALAX) powder   I believe this man has a splenic flexure syndrome. Needs to take  MiraLAX for 2 days. If the symptoms continue then we will need to get further evaluation. In particular I think a CAT scan would be the next.  Signed, Elvina SidleKurt Ladavion Savitz, MD 07/05/2015 4:20 PM

## 2015-07-05 NOTE — Patient Instructions (Signed)
Please take the Miralax powder, mixed in water, twice a day for the next two days.  If you are not improving, let us know.  The next step would be to get a CAT scan

## 2015-07-10 ENCOUNTER — Telehealth: Payer: Self-pay

## 2015-07-10 NOTE — Telephone Encounter (Signed)
Pt was told if he wasn't any better to give him a call back and the medication isn't helping. Please call [phone]     [facility] [address]

## 2015-07-10 NOTE — Telephone Encounter (Signed)
Do you want to order a CT?

## 2015-07-11 MED ORDER — HYDROCODONE-ACETAMINOPHEN 5-325 MG PO TABS: 1.0000 | ORAL_TABLET | Freq: Four times a day (QID) | ORAL | Status: DC | PRN

## 2015-07-11 NOTE — Telephone Encounter (Signed)
Spoke with patient and states that miralax not helping and he is still having left sided flank pain. Says he feels hot at times, but denies n/v, fever, chills, dysuria, or hematuria. Advised that per Dr. Cain SaupeL's note the next step is CT. States that he can not afford another bill as they are starting to pile up. States that he has urology appt 11/13. Advised that if he is not willing to do CT we can treat pain until he sees urology, but if pain persist or worsens will need to come in and CT still may need to be ordered.

## 2015-07-11 NOTE — Telephone Encounter (Signed)
Pt calling again. Dr. Elbert EwingsL is not back until tomorrow. Please advise. Thanks

## 2015-08-07 ENCOUNTER — Ambulatory Visit (INDEPENDENT_AMBULATORY_CARE_PROVIDER_SITE_OTHER): Payer: BLUE CROSS/BLUE SHIELD | Admitting: Family Medicine

## 2015-08-07 VITALS — BP 114/72 | HR 66 | Temp 98.4°F | Resp 16 | Ht 70.0 in | Wt 174.0 lb

## 2015-08-07 DIAGNOSIS — R350 Frequency of micturition: Secondary | ICD-10-CM | POA: Diagnosis not present

## 2015-08-07 DIAGNOSIS — R319 Hematuria, unspecified: Secondary | ICD-10-CM

## 2015-08-07 DIAGNOSIS — R109 Unspecified abdominal pain: Secondary | ICD-10-CM

## 2015-08-07 DIAGNOSIS — R300 Dysuria: Secondary | ICD-10-CM

## 2015-08-07 NOTE — Addendum Note (Signed)
Addended by: Eddie CandleLUCK, Doni Widmer L on: 08/07/2015 08:40 PM   Modules accepted: Orders

## 2015-08-07 NOTE — Progress Notes (Signed)
 @[facility] @  This chart was scribed for Elvina SidleKurt Rada Zegers, MD by Andrew Auaven Small, ED Scribe. This patient was seen in room 3 and the patient's care was started at 7:19 PM.  Patient ID: Joseph Bridges MRN: [id number], DOB: 07/23/1981, 34 y.o. Date of Encounter: 08/07/2015, 7:13 PM  Primary Physician: No primary care provider on file.  Chief Complaint:  Chief Complaint  Patient presents with  . Follow-up    left flank    HPI: 34 y.o. year old male with history below presents with for a follow up regarding left flank pain. Pt was seen her 1 month ago for gradually worsening left flank pain that began 5-6 months prior to the visit. Since last visit he's had worsening, constant, left flank pain that occasionally radiates to left abdomen. He also continues to urinary symptoms of dysuria and urinary frequency. He has an appointment with Urology December 5th    History reviewed. No pertinent past medical history.   Home Meds: Prior to Admission medications   Medication Sig Start Date End Date Taking? Authorizing Provider  HYDROcodone-acetaminophen (NORCO) 5-325 MG tablet Take 1 tablet by mouth every 6 (six) hours as needed. Patient not taking: Reported on 08/07/2015 07/11/15   Tishira R Brewington, PA-C  polyethylene glycol powder (GLYCOLAX/MIRALAX) powder Take 17 g by mouth 2 (two) times daily as needed. Patient not taking: Reported on 08/07/2015 07/05/15   Elvina SidleKurt Haiven Nardone, MD    Allergies: No Known Allergies  Social History   Social History  . Marital Status: Single    Spouse Name: N/A  . Number of Children: N/A  . Years of Education: N/A   Occupational History  . Not on file.   Social History Main Topics  . Smoking status: Never Smoker   . Smokeless tobacco: Not on file  . Alcohol Use: No  . Drug Use: No  . Sexual Activity: Not on file   Other Topics Concern  . Not on file   Social History Narrative     Review of Systems: Constitutional: negative for chills, fever, night  sweats, weight changes, or fatigue  HEENT: negative for vision changes, hearing loss, congestion, rhinorrhea, ST, epistaxis, or sinus pressure Cardiovascular: negative for chest pain or palpitations Respiratory: negative for hemoptysis, wheezing, shortness of breath, or cough Abdominal: negative for abdominal pain, nausea, vomiting, diarrhea, or constipation Dermatological: negative for rash Neurologic: negative for headache, dizziness, or syncope All other systems reviewed and are otherwise negative with the exception to those above and in the HPI.   Physical Exam: Blood pressure 114/72, pulse 66, temperature 98.4 F (36.9 C), resp. rate 16, height 5\' 10"  (1.778 m), weight 174 lb (78.926 kg), SpO2 98 %., Body mass index is 24.97 kg/(m^2). General: Well developed, well nourished, in no acute distress. Head: Normocephalic, atraumatic, eyes without discharge, sclera non-icteric, nares are without discharge. Bilateral auditory canals clear, TM's are without perforation, pearly grey and translucent with reflective cone of light bilaterally. Oral cavity moist, posterior pharynx without exudate, erythema, peritonsillar abscess, or post nasal drip.  Neck: Supple. No thyromegaly. Full ROM. No lymphadenopathy. Lungs: Clear bilaterally to auscultation without wheezes, rales, or rhonchi. Breathing is unlabored. Heart: RRR with S1 S2. No murmurs, rubs, or gallops appreciated. Abdomen: Soft, non-tender, non-distended with normoactive bowel sounds. No hepatomegaly. No rebound/guarding. No obvious abdominal masses. Msk:  Strength and tone normal for age. Extremities/Skin: Warm and dry. No clubbing or cyanosis. No edema. No rashes or suspicious lesions. Neuro: Alert and oriented X 3. Moves all extremities  spontaneously. Gait is normal. CNII-XII grossly in tact. Psych:  Responds to questions appropriately with a normal affect.   Results for orders placed or performed in visit on 07/05/15  POCT urinalysis  dipstick  Result Value Ref Range   Color, UA yellow yellow   Clarity, UA clear clear   Glucose, UA negative negative   Bilirubin, UA negative negative   Ketones, POC UA negative negative   Spec Grav, UA 1.020    Blood, UA trace-intact (A) negative   pH, UA 8.0    Protein Ur, POC negative negative   Urobilinogen, UA 0.2    Nitrite, UA Negative Negative   Leukocytes, UA Negative Negative  POCT Microscopic Urinalysis ([facility])  Result Value Ref Range   WBC,UR,HPF,POC None None WBC/hpf   RBC,UR,HPF,POC Few (A) None RBC/hpf   Bacteria None None, Too numerous to count   Mucus Present (A) Absent   Epithelial Cells, UR Per Microscopy Few (A) None, Too numerous to count cells/hpf    ASSESSMENT AND PLAN:  34 y.o. year old male with This chart was scribed in my presence and reviewed by me personally.  Patient has persistent left flank pain so we need further workup with imaging of the kidneys first, followed by urology consult next Monday and possible GI consult.  Note was scribed by Ms. Small   By signing my name below, I, Raven Small, attest that this documentation has been prepared under the direction and in the presence of Elvina Sidle, MD.  Electronically Signed: Andrew Au, ED Scribe. 08/07/2015. 7:21 PM.  Signed, Elvina Sidle, MD 08/07/2015 7:13 PM

## 2015-08-08 ENCOUNTER — Ambulatory Visit (HOSPITAL_BASED_OUTPATIENT_CLINIC_OR_DEPARTMENT_OTHER)
Admission: RE | Admit: 2015-08-08 | Discharge: 2015-08-08 | Disposition: A | Discharge status: Home / Self Care | Payer: BLUE CROSS/BLUE SHIELD | Source: Ambulatory Visit | Attending: Family Medicine | Admitting: Family Medicine

## 2015-08-08 DIAGNOSIS — R350 Frequency of micturition: Secondary | ICD-10-CM | POA: Insufficient documentation

## 2015-08-08 DIAGNOSIS — R300 Dysuria: Secondary | ICD-10-CM

## 2015-08-08 DIAGNOSIS — R109 Unspecified abdominal pain: Secondary | ICD-10-CM | POA: Insufficient documentation

## 2015-08-08 DIAGNOSIS — R319 Hematuria, unspecified: Secondary | ICD-10-CM | POA: Insufficient documentation

## 2015-08-15 ENCOUNTER — Other Ambulatory Visit: Payer: Self-pay | Admitting: Urology

## 2015-08-15 DIAGNOSIS — M545 Low back pain: Secondary | ICD-10-CM

## 2015-08-22 ENCOUNTER — Other Ambulatory Visit (HOSPITAL_COMMUNITY): Payer: BLUE CROSS/BLUE SHIELD

## 2016-03-07 ENCOUNTER — Other Ambulatory Visit: Payer: Self-pay | Admitting: Urology

## 2016-03-07 DIAGNOSIS — R3915 Urgency of urination: Secondary | ICD-10-CM

## 2016-04-01 ENCOUNTER — Encounter (HOSPITAL_COMMUNITY): Payer: Self-pay

## 2016-04-02 ENCOUNTER — Other Ambulatory Visit: Payer: Self-pay | Admitting: Urology

## 2016-04-02 ENCOUNTER — Ambulatory Visit (HOSPITAL_COMMUNITY)
Admission: RE | Admit: 2016-04-02 | Discharge: 2016-04-02 | Disposition: A | Discharge status: Home / Self Care | Payer: BLUE CROSS/BLUE SHIELD | Source: Ambulatory Visit | Attending: Urology | Admitting: Urology

## 2016-04-02 DIAGNOSIS — R3915 Urgency of urination: Secondary | ICD-10-CM

## 2016-04-08 ENCOUNTER — Ambulatory Visit (HOSPITAL_COMMUNITY)
Admission: RE | Admit: 2016-04-08 | Discharge: 2016-04-08 | Disposition: A | Discharge status: Home / Self Care | Payer: BLUE CROSS/BLUE SHIELD | Source: Ambulatory Visit | Attending: Urology | Admitting: Urology

## 2016-04-08 DIAGNOSIS — R3915 Urgency of urination: Secondary | ICD-10-CM | POA: Diagnosis present

## 2016-04-08 DIAGNOSIS — M5442 Lumbago with sciatica, left side: Secondary | ICD-10-CM | POA: Diagnosis not present

## 2016-04-08 MED ORDER — GADOBENATE DIMEGLUMINE 529 MG/ML IV SOLN: 15.0000 mL | Freq: Once | INTRAVENOUS | Status: DC | PRN

## 2016-04-25 ENCOUNTER — Encounter: Payer: Self-pay | Admitting: Neurology

## 2016-04-25 ENCOUNTER — Ambulatory Visit (INDEPENDENT_AMBULATORY_CARE_PROVIDER_SITE_OTHER): Payer: BLUE CROSS/BLUE SHIELD | Admitting: Neurology

## 2016-04-25 VITALS — BP 123/71 | HR 75 | Ht 70.0 in | Wt 176.2 lb

## 2016-04-25 DIAGNOSIS — R202 Paresthesia of skin: Secondary | ICD-10-CM | POA: Diagnosis not present

## 2016-04-25 DIAGNOSIS — E531 Pyridoxine deficiency: Secondary | ICD-10-CM | POA: Diagnosis not present

## 2016-04-25 DIAGNOSIS — E538 Deficiency of other specified B group vitamins: Secondary | ICD-10-CM | POA: Diagnosis not present

## 2016-04-25 DIAGNOSIS — M546 Pain in thoracic spine: Secondary | ICD-10-CM

## 2016-04-25 DIAGNOSIS — R29898 Other symptoms and signs involving the musculoskeletal system: Secondary | ICD-10-CM | POA: Diagnosis not present

## 2016-04-25 DIAGNOSIS — E519 Thiamine deficiency, unspecified: Secondary | ICD-10-CM | POA: Diagnosis not present

## 2016-04-25 DIAGNOSIS — G629 Polyneuropathy, unspecified: Secondary | ICD-10-CM | POA: Diagnosis not present

## 2016-04-25 DIAGNOSIS — N319 Neuromuscular dysfunction of bladder, unspecified: Secondary | ICD-10-CM

## 2016-04-25 DIAGNOSIS — R51 Headache: Secondary | ICD-10-CM

## 2016-04-25 DIAGNOSIS — E559 Vitamin D deficiency, unspecified: Secondary | ICD-10-CM | POA: Diagnosis not present

## 2016-04-25 DIAGNOSIS — M5440 Lumbago with sciatica, unspecified side: Secondary | ICD-10-CM

## 2016-04-25 DIAGNOSIS — R208 Other disturbances of skin sensation: Secondary | ICD-10-CM

## 2016-04-25 DIAGNOSIS — R7309 Other abnormal glucose: Secondary | ICD-10-CM | POA: Diagnosis not present

## 2016-04-25 DIAGNOSIS — M2550 Pain in unspecified joint: Secondary | ICD-10-CM | POA: Diagnosis not present

## 2016-04-25 DIAGNOSIS — R5383 Other fatigue: Secondary | ICD-10-CM

## 2016-04-25 DIAGNOSIS — R519 Headache, unspecified: Secondary | ICD-10-CM
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
## 2016-04-27 ENCOUNTER — Encounter: Payer: Self-pay | Admitting: Neurology

## 2016-04-27 DIAGNOSIS — M549 Dorsalgia, unspecified: Secondary | ICD-10-CM | POA: Insufficient documentation

## 2016-04-27 DIAGNOSIS — R202 Paresthesia of skin: Secondary | ICD-10-CM | POA: Insufficient documentation

## 2016-04-27 NOTE — Patient Instructions (Signed)
Overall you are doing fairly well but I do want to suggest a few things today:   Remember to drink plenty of fluid, eat healthy meals and do not skip any meals. Try to eat protein with a every meal and eat a healthy snack such as fruit or nuts in between meals. Try to keep a regular sleep-wake schedule and try to exercise daily, particularly in the form of walking, 20-30 minutes a day, if you can.   As far as your medications are concerned, I would like to suggest  As far as diagnostic testing: labs, imaging   My clinical assistant and will answer any of your questions and relay your messages to me and also relay most of my messages to you.   Our phone number is [phone]. We also have an after hours call service for urgent matters and there is a physician on-call for urgent questions. For any emergencies you know to call 911 or go to the nearest emergency room

## 2016-04-30 LAB — METHYLMALONIC ACID, SERUM: METHYLMALONIC ACID: 71 nmol/L (ref 0–378)

## 2016-04-30 LAB — HEAVY METALS, BLOOD
ARSENIC: 8 ug/L (ref 2–23)
Lead, Blood: 2 ug/dL (ref 0–19)
MERCURY: 2.1 ug/L (ref 0.0–14.9)

## 2016-04-30 LAB — B. BURGDORFI ANTIBODIES: Lyme IgG/IgM Ab: <0.91 {ISR} (ref 0.00–0.90)

## 2016-04-30 LAB — ANGIOTENSIN CONVERTING ENZYME: ANGIO CONVERT ENZYME: 55 U/L (ref 14–82)

## 2016-04-30 LAB — HEPATITIS C ANTIBODY: Hep C Virus Ab: <0.1 s/co ratio (ref 0.0–0.9)

## 2016-04-30 LAB — B12 AND FOLATE PANEL
Folate: 11 ng/mL (ref >3.0)
Vitamin B-12: 1273 pg/mL — ABNORMAL HIGH (ref 211–946)

## 2016-04-30 LAB — HEMOGLOBIN A1C
Est. average glucose Bld gHb Est-mCnc: 126 mg/dL
Hgb A1c MFr Bld: 6 % — ABNORMAL HIGH (ref 4.8–5.6)

## 2016-04-30 LAB — ANA W/REFLEX: Anti Nuclear Antibody(ANA): NEGATIVE

## 2016-04-30 LAB — VITAMIN D 25 HYDROXY (VIT D DEFICIENCY, FRACTURES): Vit D, 25-Hydroxy: 26.8 ng/mL — ABNORMAL LOW (ref 30.0–100.0)

## 2016-04-30 LAB — VITAMIN B6: VITAMIN B6: 23 ug/L (ref 5.3–46.7)

## 2016-04-30 LAB — SEDIMENTATION RATE: SED RATE: 11 mm/h (ref 0–15)

## 2016-04-30 LAB — TSH: TSH: 1.18 u[IU]/mL (ref 0.450–4.500)

## 2016-04-30 LAB — SJOGREN'S SYNDROME ANTIBODS(SSA + SSB)

## 2016-04-30 LAB — RHEUMATOID FACTOR

## 2016-04-30 LAB — VITAMIN B1: Thiamine: 68.5 nmol/L (ref 66.5–200.0)

## 2016-05-01 ENCOUNTER — Telehealth: Payer: Self-pay | Admitting: *Deleted

## 2016-05-01 NOTE — Telephone Encounter (Signed)
LVM for pt to call about results. Gave [facility] phone number.  

## 2016-05-01 NOTE — Telephone Encounter (Signed)
-----   Message from Anson FretAntonia B Ahern, MD sent at 05/01/2016  9:25 AM EDT ----- HgbA1c was 6.0. Patient is pre-diabetic. He should adopt diet modification and exercise and weight loss and follow up with primray care. Please forward results to primary care. Also he is vitamin D deficient. He can buy OTC vitamin D 1000-2000 IU daily thanks

## 2016-05-06 NOTE — Telephone Encounter (Signed)
Left another message for a return call

## 2016-05-09 ENCOUNTER — Encounter: Payer: Self-pay | Admitting: *Deleted

## 2016-05-09 NOTE — Telephone Encounter (Signed)
Faxed OV note, lab results to Dr Annabell HowellsWrenn, referring physician since no PCP on file. FaxL [phone]. Received confirmation.

## 2016-05-09 NOTE — Telephone Encounter (Signed)
Tried calling patient again. Went to VM, but it was full and unable to LVM. Will send letter since unable to reach patient.

## 2016-05-15 ENCOUNTER — Other Ambulatory Visit: Payer: BLUE CROSS/BLUE SHIELD

## 2016-09-18 ENCOUNTER — Encounter: Payer: Self-pay | Admitting: Emergency Medicine

## 2016-09-18 ENCOUNTER — Ambulatory Visit (INDEPENDENT_AMBULATORY_CARE_PROVIDER_SITE_OTHER): Payer: Managed Care, Other (non HMO) | Admitting: Emergency Medicine

## 2016-09-18 VITALS — BP 122/80 | HR 78 | Temp 98.6°F | Resp 16 | Ht 70.0 in | Wt 173.0 lb

## 2016-09-18 DIAGNOSIS — M62830 Muscle spasm of back: Secondary | ICD-10-CM

## 2016-09-18 DIAGNOSIS — R202 Paresthesia of skin: Secondary | ICD-10-CM | POA: Diagnosis not present

## 2016-09-18 DIAGNOSIS — M545 Low back pain, unspecified: Secondary | ICD-10-CM

## 2016-09-18 DIAGNOSIS — G8929 Other chronic pain: Secondary | ICD-10-CM

## 2016-09-18 MED ORDER — DICLOFENAC SODIUM 75 MG PO TBEC
75.0000 mg | DELAYED_RELEASE_TABLET | Freq: Two times a day (BID) | ORAL | 0 refills | Status: AC
Start: 2016-09-18 — End: 2016-09-21

## 2016-09-18 MED ORDER — METAXALONE 800 MG PO TABS
800.0000 mg | ORAL_TABLET | Freq: Three times a day (TID) | ORAL | 0 refills | Status: AC
Start: 2016-09-18 — End: 2016-09-21

## 2016-09-18 NOTE — Progress Notes (Signed)
Joseph Bridges 35 y.o.   Chief Complaint  Patient presents with  . Back Pain    x 2 weeks, lower back pain travel down both legs     HISTORY OF PRESENT ILLNESS: This is a 36 y.o. male complaining of chronic low back pain; has had extensive workup including MRI lumbar spine, blood work, neurology consult; all WNL. Pain got worse the past several days; denies injury.  Back Pain  This is a recurrent problem. The current episode started in the past 7 days. The problem occurs constantly. The problem has been waxing and waning since onset. The pain is present in the lumbar spine. The quality of the pain is described as aching. Radiates to: both legs. The pain is at a severity of 5/10. The pain is moderate. The pain is the same all the time. The symptoms are aggravated by position, bending and twisting. Associated symptoms include leg pain, numbness, paresthesias and tingling. Pertinent negatives include no abdominal pain, bladder incontinence, bowel incontinence, chest pain, dysuria, fever, headaches, paresis, pelvic pain, perianal numbness, weakness or weight loss. Risk factors: none. He has tried NSAIDs, analgesics and ice for the symptoms. The treatment provided mild relief.     Prior to Admission medications   Medication Sig Start Date End Date Taking? Authorizing Provider  ibuprofen (ADVIL,MOTRIN) 200 MG tablet Take 200 mg by mouth every 6 (six) hours as needed.   Yes Historical Provider, MD    No Known Allergies  Patient Active Problem List   Diagnosis Date Noted  . Paresthesias 04/27/2016  . Back pain 04/27/2016  . Prostatitis 05/30/2015    Past Medical History:  Diagnosis Date  . Back pain   . Neurogenic bladder   . Paresthesias     Past Surgical History:  Procedure Laterality Date  . no surgical history      Social History   Social History  . Marital status: Single    Spouse name: N/A  . Number of children: 2  . Years of education: 12   Occupational History    . Factory    Social History Main Topics  . Smoking status: Never Smoker  . Smokeless tobacco: Never Used  . Alcohol use No  . Drug use: No  . Sexual activity: Not on file   Other Topics Concern  . Not on file   Social History Narrative   Lives alone   Caffeine use: none    No family history on file.   Review of Systems  Constitutional: Negative for fever and weight loss.  HENT: Negative.   Eyes: Negative.   Respiratory: Negative for cough, hemoptysis and shortness of breath.   Cardiovascular: Negative.  Negative for chest pain and palpitations.  Gastrointestinal: Negative for abdominal pain, bowel incontinence, constipation, diarrhea, nausea and vomiting.  Genitourinary: Negative for bladder incontinence, dysuria, flank pain, hematuria, pelvic pain and urgency.  Musculoskeletal: Positive for back pain. Negative for joint pain and myalgias.  Skin: Negative.   Neurological: Positive for tingling, numbness and paresthesias. Negative for dizziness, sensory change, focal weakness, weakness and headaches.  Endo/Heme/Allergies: Negative.   Psychiatric/Behavioral: Negative.   All other systems reviewed and are negative.  Vitals:   09/18/16 1537  BP: 122/80  Pulse: 78  Resp: 16  Temp: 98.6 F (37 C)     Physical Exam  Constitutional: He is oriented to person, place, and time. He appears well-developed and well-nourished.  HENT:  Head: Normocephalic and atraumatic.  Nose: Nose normal.  Mouth/Throat: Oropharynx  is clear and moist.  Eyes: Conjunctivae and EOM are normal. Pupils are equal, round, and reactive to light.  Neck: Normal range of motion. Neck supple.  Cardiovascular: Normal rate, regular rhythm, normal heart sounds and intact distal pulses.   Pulmonary/Chest: Effort normal and breath sounds normal.  Abdominal: Soft. Bowel sounds are normal. He exhibits no distension. There is no tenderness. There is no guarding.  Musculoskeletal: Normal range of motion.   Neurological: He is alert and oriented to person, place, and time. He displays normal reflexes. No sensory deficit. He exhibits normal muscle tone. Coordination normal.  Skin: Skin is warm and dry. Capillary refill takes less than 2 seconds.  Psychiatric: He has a normal mood and affect. His behavior is normal.  Nursing note and vitals reviewed.  Labs, Imaging, and Consult reports reviewed and d/w patient. Normal results. Will start muscle relaxants and NSAID.  ASSESSMENT & PLAN: Joseph Bridges was seen today for back pain.  Diagnoses and all orders for this visit:  Chronic bilateral low back pain without sciatica  Muscle spasm of back  Other orders -     metaxalone (SKELAXIN) 800 MG tablet; Take 1 tablet (800 mg total) by mouth 3 (three) times daily. -     diclofenac (VOLTAREN) 75 MG EC tablet; Take 1 tablet (75 mg total) by mouth 2 (two) times daily.    Patient Instructions       IF you received an x-ray today, you will receive an invoice from [facility]. Please contact [facility] at [phone] with questions or concerns regarding your invoice.   IF you received labwork today, you will receive an invoice from [facility]. Please contact [facility] at [phone] with questions or concerns regarding your invoice.   Our billing staff will not be able to assist you with questions regarding bills from these companies.  You will be contacted with the lab results as soon as they are available. The fastest way to get your results is to activate your My Chart account. Instructions are located on the last page of this paperwork. If you have not heard from [place] regarding the results in 2 weeks, please contact this office.      Back Pain, Adult Introduction Back pain is very common. The pain often gets better over time. The cause of back pain is usually not dangerous. Most people can learn to manage their back pain on their own. Follow these instructions at home: Watch  your back pain for any changes. The following actions may help to lessen any pain you are feeling:  Stay active. Start with short walks on flat ground if you can. Try to walk farther each day.  Exercise regularly as told by your doctor. Exercise helps your back heal faster. It also helps avoid future injury by keeping your muscles strong and flexible.  Do not sit, drive, or stand in one place for more than 30 minutes.  Do not stay in bed. Resting more than 1-2 days can slow down your recovery.  Be careful when you bend or lift an object. Use good form when lifting:  Bend at your knees.  Keep the object close to your body.  Do not twist.  Sleep on a firm mattress. Lie on your side, and bend your knees. If you lie on your back, put a pillow under your knees.  Take medicines only as told by your doctor.  Put ice on the injured area.  Put ice in a plastic bag.  Place a towel  between your skin and the bag.  Leave the ice on for 20 minutes, 2-3 times a day for the first 2-3 days. After that, you can switch between ice and heat packs.  Avoid feeling anxious or stressed. Find good ways to deal with stress, such as exercise.  Maintain a healthy weight. Extra weight puts stress on your back. Contact a doctor if:  You have pain that does not go away with rest or medicine.  You have worsening pain that goes down into your legs or buttocks.  You have pain that does not get better in one week.  You have pain at night.  You lose weight.  You have a fever or chills. Get help right away if:  You cannot control when you poop (bowel movement) or pee (urinate).  Your arms or legs feel weak.  Your arms or legs lose feeling (numbness).  You feel sick to your stomach (nauseous) or throw up (vomit).  You have belly (abdominal) pain.  You feel like you may pass out (faint). This information is not intended to replace advice given to you by your health care provider. Make sure you  discuss any questions you have with your health care provider. Document Released: 02/12/2008 Document Revised: 02/01/2016 Document Reviewed: 12/28/2013  2017 [employer]vier      Edwina BarthMiguel Maycel Riffe, MD [facility]

## 2016-09-18 NOTE — Patient Instructions (Addendum)
     IF you received an x-ray today, you will receive an invoice from [facility]. Please contact [facility] at [phone] with questions or concerns regarding your invoice.   IF you received labwork today, you will receive an invoice from [facility]. Please contact [facility] at [phone] with questions or concerns regarding your invoice.   Our billing staff will not be able to assist you with questions regarding bills from these companies.  You will be contacted with the lab results as soon as they are available. The fastest way to get your results is to activate your My Chart account. Instructions are located on the last page of this paperwork. If you have not heard from us regarding the results in 2 weeks, please contact this office.      Back Pain, Adult Introduction Back pain is very common. The pain often gets better over time. The cause of back pain is usually not dangerous. Most people can learn to manage their back pain on their own. Follow these instructions at home: Watch your back pain for any changes. The following actions may help to lessen any pain you are feeling:  Stay active. Start with short walks on flat ground if you can. Try to walk farther each day.  Exercise regularly as told by your doctor. Exercise helps your back heal faster. It also helps avoid future injury by keeping your muscles strong and flexible.  Do not sit, drive, or stand in one place for more than 30 minutes.  Do not stay in bed. Resting more than 1-2 days can slow down your recovery.  Be careful when you bend or lift an object. Use good form when lifting:  Bend at your knees.  Keep the object close to your body.  Do not twist.  Sleep on a firm mattress. Lie on your side, and bend your knees. If you lie on your back, put a pillow under your knees.  Take medicines only as told by your doctor.  Put ice on the injured area.  Put ice in a plastic bag.  Place a towel  between your skin and the bag.  Leave the ice on for 20 minutes, 2-3 times a day for the first 2-3 days. After that, you can switch between ice and heat packs.  Avoid feeling anxious or stressed. Find good ways to deal with stress, such as exercise.  Maintain a healthy weight. Extra weight puts stress on your back. Contact a doctor if:  You have pain that does not go away with rest or medicine.  You have worsening pain that goes down into your legs or buttocks.  You have pain that does not get better in one week.  You have pain at night.  You lose weight.  You have a fever or chills. Get help right away if:  You cannot control when you poop (bowel movement) or pee (urinate).  Your arms or legs feel weak.  Your arms or legs lose feeling (numbness).  You feel sick to your stomach (nauseous) or throw up (vomit).  You have belly (abdominal) pain.  You feel like you may pass out (faint). This information is not intended to replace advice given to you by your health care provider. Make sure you discuss any questions you have with your health care provider. Document Released: 02/12/2008 Document Revised: 02/01/2016 Document Reviewed: 12/28/2013  2017 [employer]  

## 2018-03-17 IMAGING — MR MR LUMBAR SPINE WO/W CM
4 of 7 series · 19 of 48 positions shown · IV contrast (Yes)
Comparison: None.

CLINICAL DATA: Urgency of urination. Extreme back pain. BILATERAL
leg pain.

EXAM:
MRI LUMBAR SPINE WITHOUT AND WITH CONTRAST
TECHNIQUE: Multiplanar and multiecho pulse sequences of the lumbar spine were
obtained without and with intravenous contrast.
CONTRAST:  MultiHance 15 mL.

[Series 4: T1 · sagittal · 4.0mm · 0.51mm/px · 3 of 14 slices shown (1 of 2)]
[im 1/14]
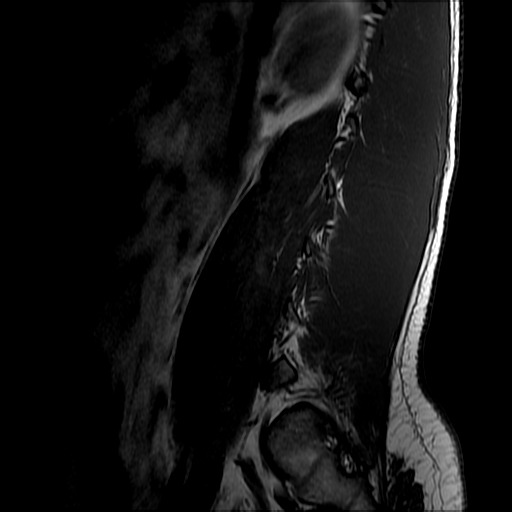
[im 7/14]
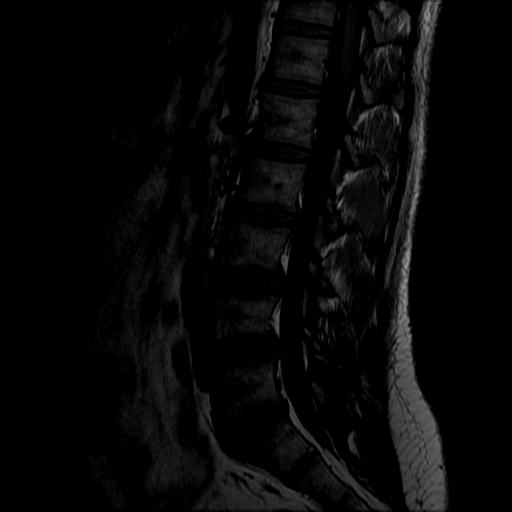
[im 14/14]
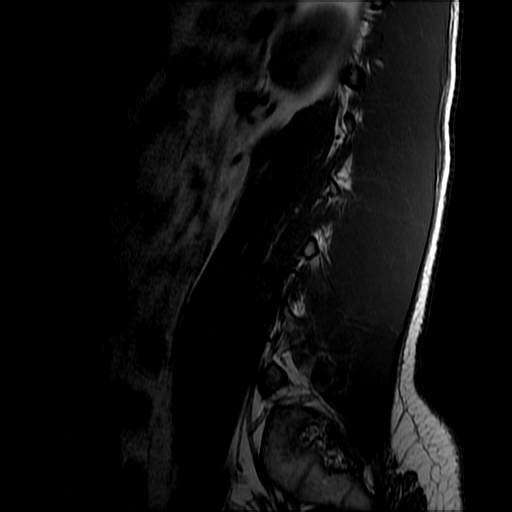

[Series 6: T2 · axial · 4.0mm · 0.39mm/px · z∈[-181,+17]mm · 10 of 41 slices shown]
[im 1/41]
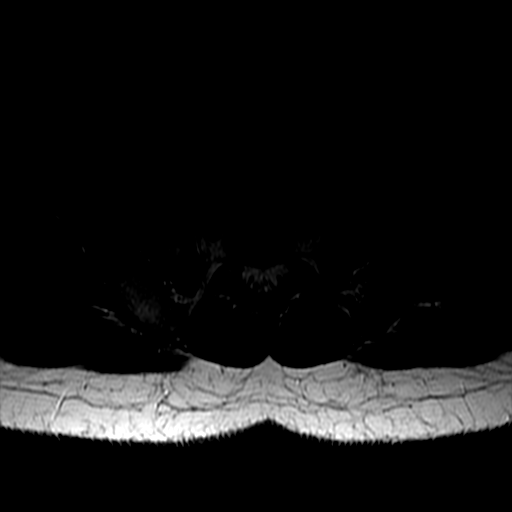
[im 5/41]
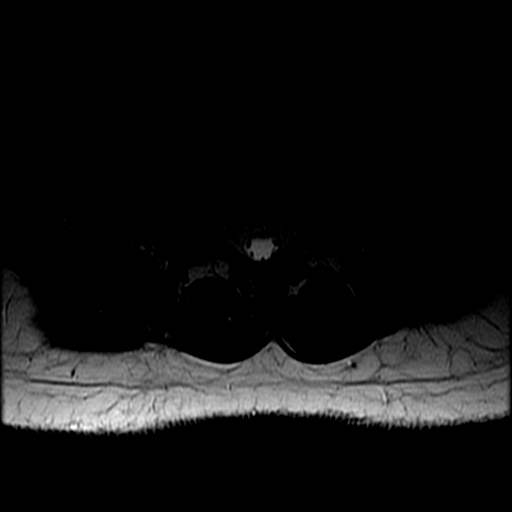
[im 9/41]
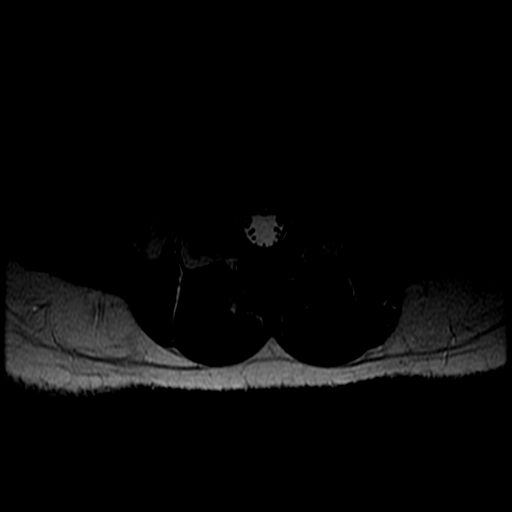
[im 13/41]
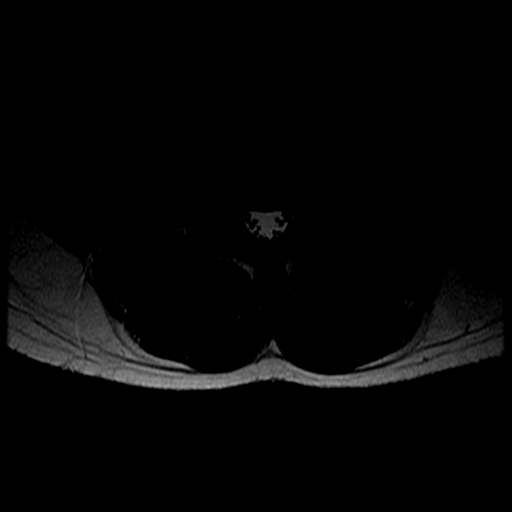
[im 17/41]
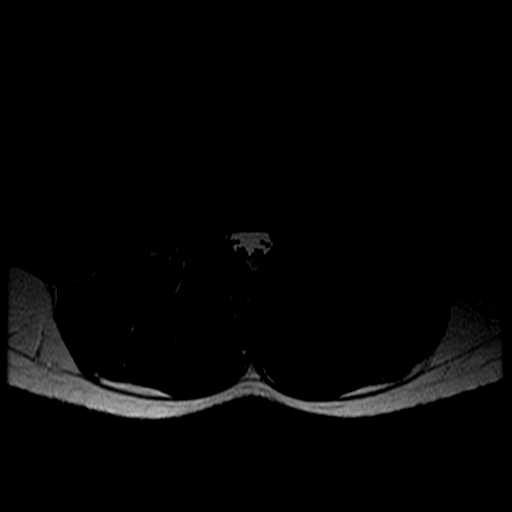
[im 21/41]
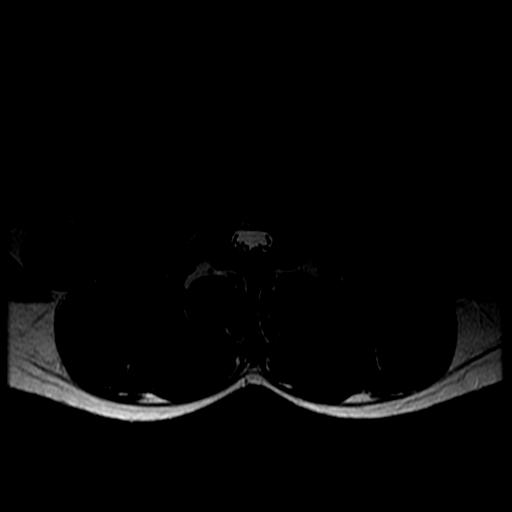
[im 25/41]
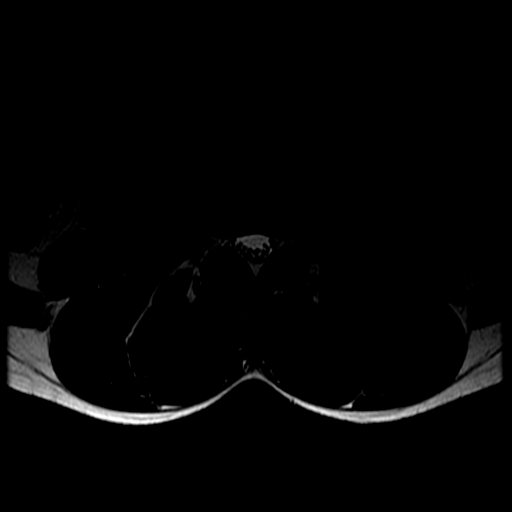
[im 29/41]
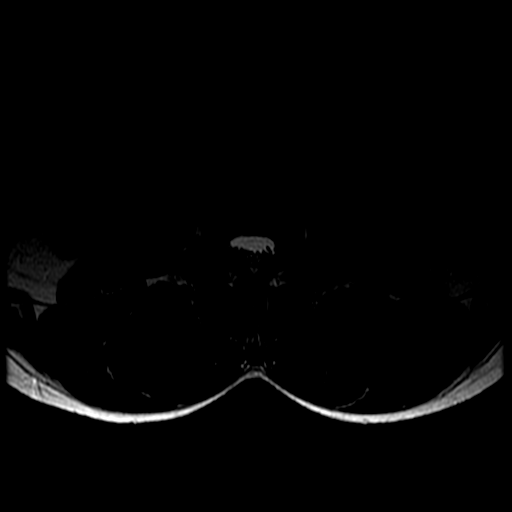
[im 33/41]
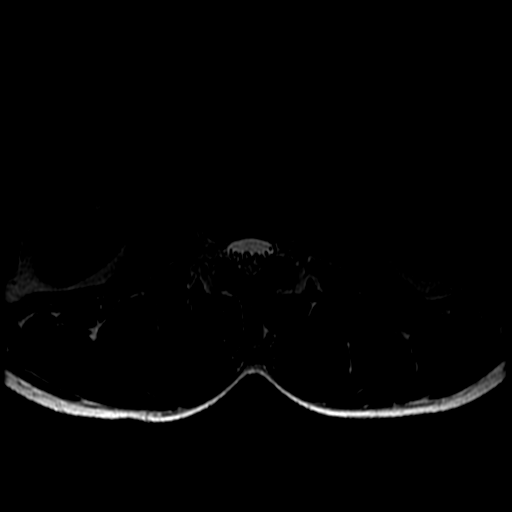
[im 37/41]
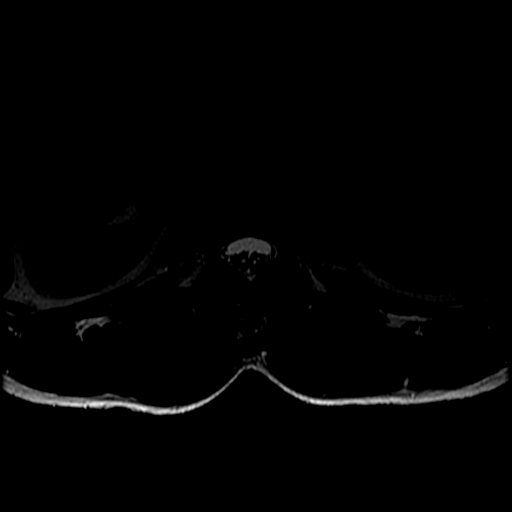

[Series 7: T1 · axial · 4.0mm · 0.39mm/px · z∈[-161,+17]mm · 3 of 41 slices shown (2 of 2)]
[im 5/41]
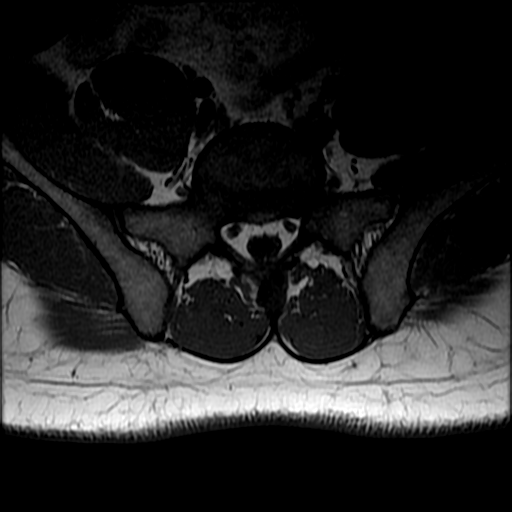
[im 21/41]
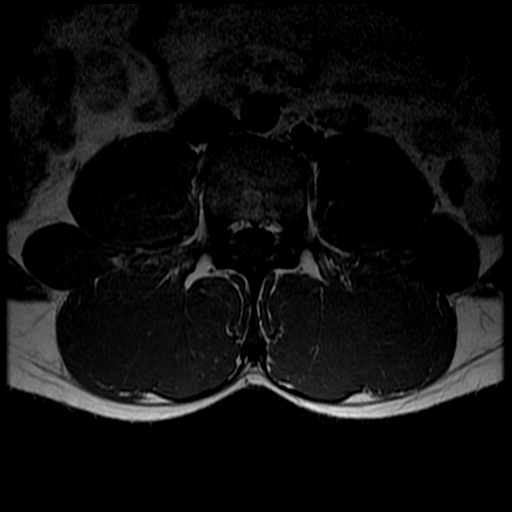
[im 37/41]
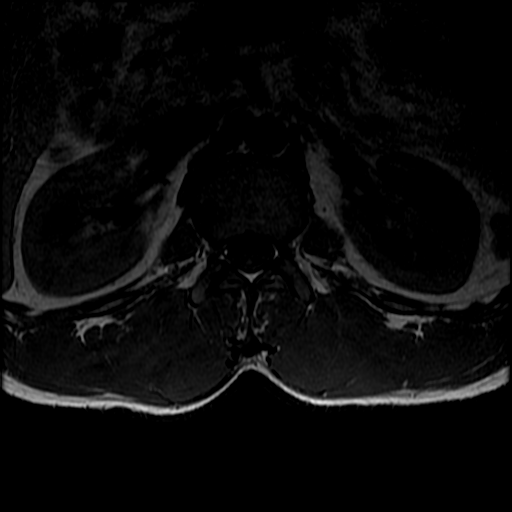

[Series 8: T2 post-contrast · sagittal · 4.0mm · 0.51mm/px · 3 of 14 slices shown]
[im 1/14]
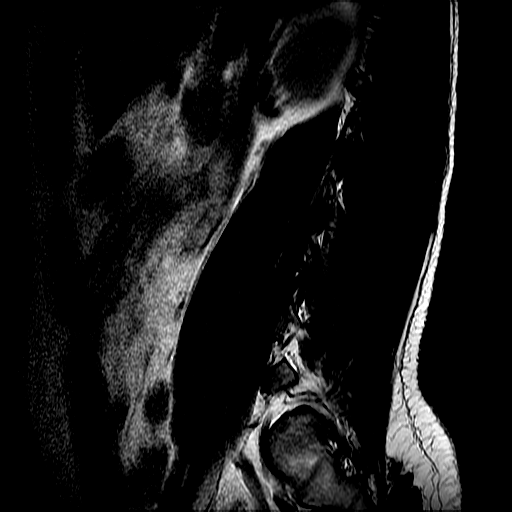
[im 9/14]
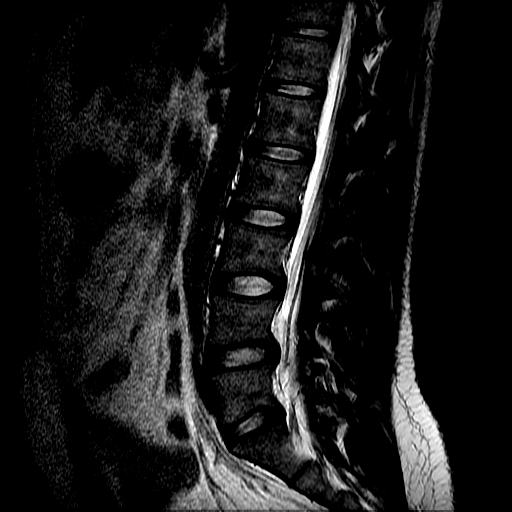
[im 14/14]
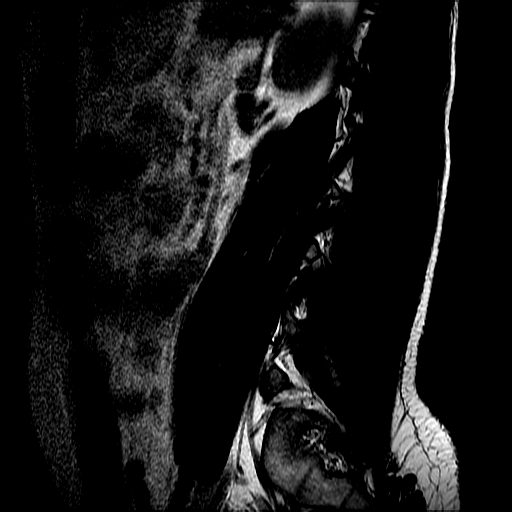

[19 of 48 positions shown; findings below may reference images not displayed]

FINDINGS: Segmentation:  Standard.

Alignment:  Physiologic.

Vertebrae: No fracture, evidence of discitis, or bone lesion. No
abnormal enhancement postcontrast.

Conus medullaris: Extends to the L1 level and appears normal.

Paraspinal and other soft tissues: Negative.

Disc levels:

L1-L2:  Normal

L2-L3:  Normal

L3-L4:  Normal

L4-L5:  Normal

L5-S1: Good disc height and hydration. Slight annular bulge. Mild
facet arthropathy. No impingement.
IMPRESSION: No lumbar disc protrusion, spinal stenosis, or neural impingement.

No abnormal enhancement to suggest an inflammatory or neoplastic
process in the vertebrae or spinal canal.
